# Patient Record
Sex: Male | Born: 1980 | Hispanic: Yes | Marital: Single | Smoking: Current every day smoker
Health system: Southern US, Community
[De-identification: ages and names within clinical notes are randomized; demographics above are authoritative.]

---

## 2020-07-15 ENCOUNTER — Other Ambulatory Visit: Payer: Self-pay

## 2020-07-15 ENCOUNTER — Encounter: Payer: Self-pay | Admitting: Emergency Medicine

## 2020-07-15 ENCOUNTER — Emergency Department
Admission: EM | Admit: 2020-07-15 | Discharge: 2020-07-15 | Disposition: A | Discharge status: Home / Self Care | Payer: Self-pay | Attending: Emergency Medicine | Admitting: Emergency Medicine

## 2020-07-15 DIAGNOSIS — R079 Chest pain, unspecified: Secondary | ICD-10-CM | POA: Insufficient documentation

## 2020-07-15 DIAGNOSIS — R1030 Lower abdominal pain, unspecified: Secondary | ICD-10-CM | POA: Insufficient documentation

## 2020-07-15 DIAGNOSIS — Z5321 Procedure and treatment not carried out due to patient leaving prior to being seen by health care provider: Secondary | ICD-10-CM | POA: Insufficient documentation

## 2020-07-15 LAB — COMPREHENSIVE METABOLIC PANEL
ALT: 60 U/L — ABNORMAL HIGH (ref 0–44)
AST: 76 U/L — ABNORMAL HIGH (ref 15–41)
Albumin: 4.4 g/dL (ref 3.5–5.0)
Alkaline Phosphatase: 64 U/L (ref 38–126)
Anion gap: 13 (ref 5–15)
BUN: 7 mg/dL (ref 6–20)
CO2: 23 mmol/L (ref 22–32)
Calcium: 9.1 mg/dL (ref 8.9–10.3)
Chloride: 100 mmol/L (ref 98–111)
Creatinine, Ser: 0.97 mg/dL (ref 0.61–1.24)
GFR, Estimated: >60 mL/min (ref >60)
Glucose, Bld: 103 mg/dL — ABNORMAL HIGH (ref 70–99)
Potassium: 3.4 mmol/L — ABNORMAL LOW (ref 3.5–5.1)
Sodium: 136 mmol/L (ref 135–145)
Total Bilirubin: 0.6 mg/dL (ref 0.3–1.2)
Total Protein: 8.2 g/dL — ABNORMAL HIGH (ref 6.5–8.1)

## 2020-07-15 LAB — URINALYSIS, COMPLETE (UACMP) WITH MICROSCOPIC
Bacteria, UA: NONE SEEN
Bilirubin Urine: NEGATIVE
Glucose, UA: NEGATIVE mg/dL
Ketones, ur: NEGATIVE mg/dL
Leukocytes,Ua: NEGATIVE
Nitrite: NEGATIVE
Protein, ur: 30 mg/dL — AB
Specific Gravity, Urine: 1.018 (ref 1.005–1.030)
pH: 5 (ref 5.0–8.0)

## 2020-07-15 LAB — CBC
HCT: 41.1 % (ref 39.0–52.0)
Hemoglobin: 13.4 g/dL (ref 13.0–17.0)
MCH: 29.2 pg (ref 26.0–34.0)
MCHC: 32.6 g/dL (ref 30.0–36.0)
MCV: 89.5 fL (ref 80.0–100.0)
Platelets: 352 10*3/uL (ref 150–400)
RBC: 4.59 MIL/uL (ref 4.22–5.81)
RDW: 18.2 % — ABNORMAL HIGH (ref 11.5–15.5)
WBC: 5.7 10*3/uL (ref 4.0–10.5)
nRBC: 0 % (ref 0.0–0.2)

## 2020-07-15 LAB — LIPASE, BLOOD: Lipase: 32 U/L (ref 11–51)

## 2020-07-15 NOTE — ED Triage Notes (Addendum)
Pt arrived via POV with reports of low abd pain since yesterday as well as R side pain in chest. Pt states he feels bloated and cramping. Pt states pain started after alcohol use.  Denies any vomiting or diarrhea, c/o nausea.  Denies any abdominal surgeries

## 2020-07-15 NOTE — ED Notes (Signed)
Pt called for VS reassessment in lobby. Unable to be located in ED or outside. Spanish interpreter also called pt name with no answer.

## 2020-07-15 NOTE — ED Notes (Signed)
Called multiple times for VS reassessment, no answer

## 2021-08-10 ENCOUNTER — Other Ambulatory Visit: Payer: Self-pay

## 2021-08-10 ENCOUNTER — Emergency Department: Payer: Self-pay

## 2021-08-10 ENCOUNTER — Emergency Department
Admission: EM | Admit: 2021-08-10 | Discharge: 2021-08-10 | Disposition: A | Discharge status: Home / Self Care | Payer: Self-pay | Attending: Emergency Medicine | Admitting: Emergency Medicine

## 2021-08-10 DIAGNOSIS — R072 Precordial pain: Secondary | ICD-10-CM | POA: Insufficient documentation

## 2021-08-10 LAB — CBC
HCT: 46.5 % (ref 39.0–52.0)
Hemoglobin: 15.2 g/dL (ref 13.0–17.0)
MCH: 29.8 pg (ref 26.0–34.0)
MCHC: 32.7 g/dL (ref 30.0–36.0)
MCV: 91.2 fL (ref 80.0–100.0)
Platelets: 380 10*3/uL (ref 150–400)
RBC: 5.1 MIL/uL (ref 4.22–5.81)
RDW: 13.9 % (ref 11.5–15.5)
WBC: 4.8 10*3/uL (ref 4.0–10.5)
nRBC: 0 % (ref 0.0–0.2)

## 2021-08-10 LAB — BASIC METABOLIC PANEL
Anion gap: 11 (ref 5–15)
BUN: 7 mg/dL (ref 6–20)
CO2: 22 mmol/L (ref 22–32)
Calcium: 8.9 mg/dL (ref 8.9–10.3)
Chloride: 107 mmol/L (ref 98–111)
Creatinine, Ser: 0.63 mg/dL (ref 0.61–1.24)
GFR, Estimated: >60 mL/min (ref >60)
Glucose, Bld: 105 mg/dL — ABNORMAL HIGH (ref 70–99)
Potassium: 3.8 mmol/L (ref 3.5–5.1)
Sodium: 140 mmol/L (ref 135–145)

## 2021-08-10 LAB — TROPONIN I (HIGH SENSITIVITY): Troponin I (High Sensitivity): <2 ng/L (ref <18)

## 2021-08-10 MED ORDER — IBUPROFEN 400 MG PO TABS: 400.0000 mg | ORAL_TABLET | Freq: Four times a day (QID) | ORAL | 0 refills | Status: DC | PRN

## 2021-08-10 MED ORDER — LIDOCAINE 5 % EX PTCH: 1.0000 | MEDICATED_PATCH | Freq: Two times a day (BID) | CUTANEOUS | 0 refills | Status: AC
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
## 2021-08-10 NOTE — ED Notes (Signed)
Signature pad not working at this time. Pt verbalized understanding of discharge instructions. Pt denies any question. Pt will call a taxi to come and pick him up. Pt ambulatory without difficulty.
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
## 2021-08-10 NOTE — Discharge Instructions (Addendum)
Take tylenol 1g every 8 hours Take your aleve And use patches for pain.   Return to ER if develop pain in lower abdomen or any other concerns.

## 2023-02-14 IMAGING — CR DG CHEST 2V
1 series · 2 of 2 positions shown · non-contrast
Comparison: No priors.

CLINICAL DATA: 40-year-old male with history of chest pain.

EXAM:
CHEST - 2 VIEW

[Series 1: dg chest 2 view · 0.14mm/px · 2 of 2 slices shown]
[im 1/2]
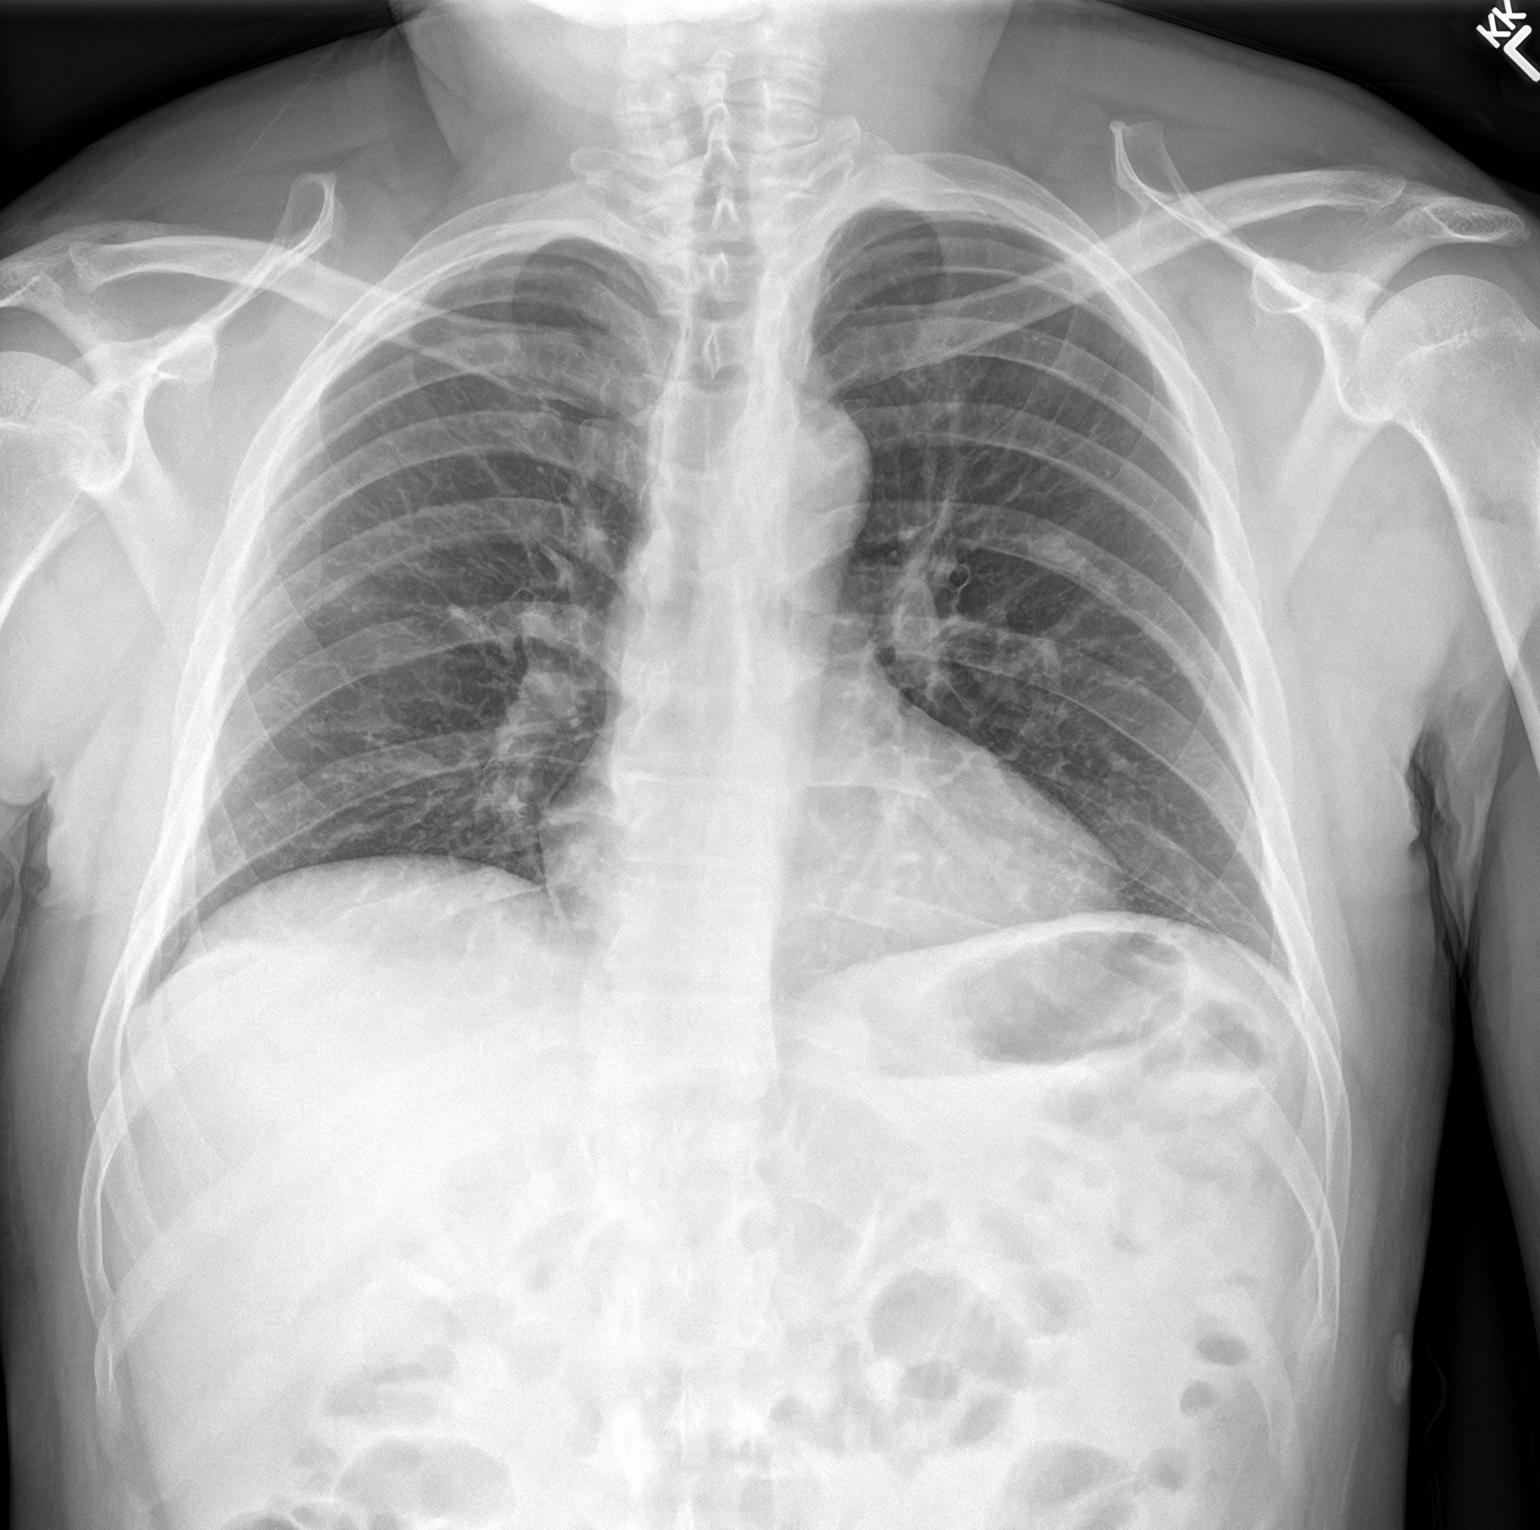
[im 2/2]
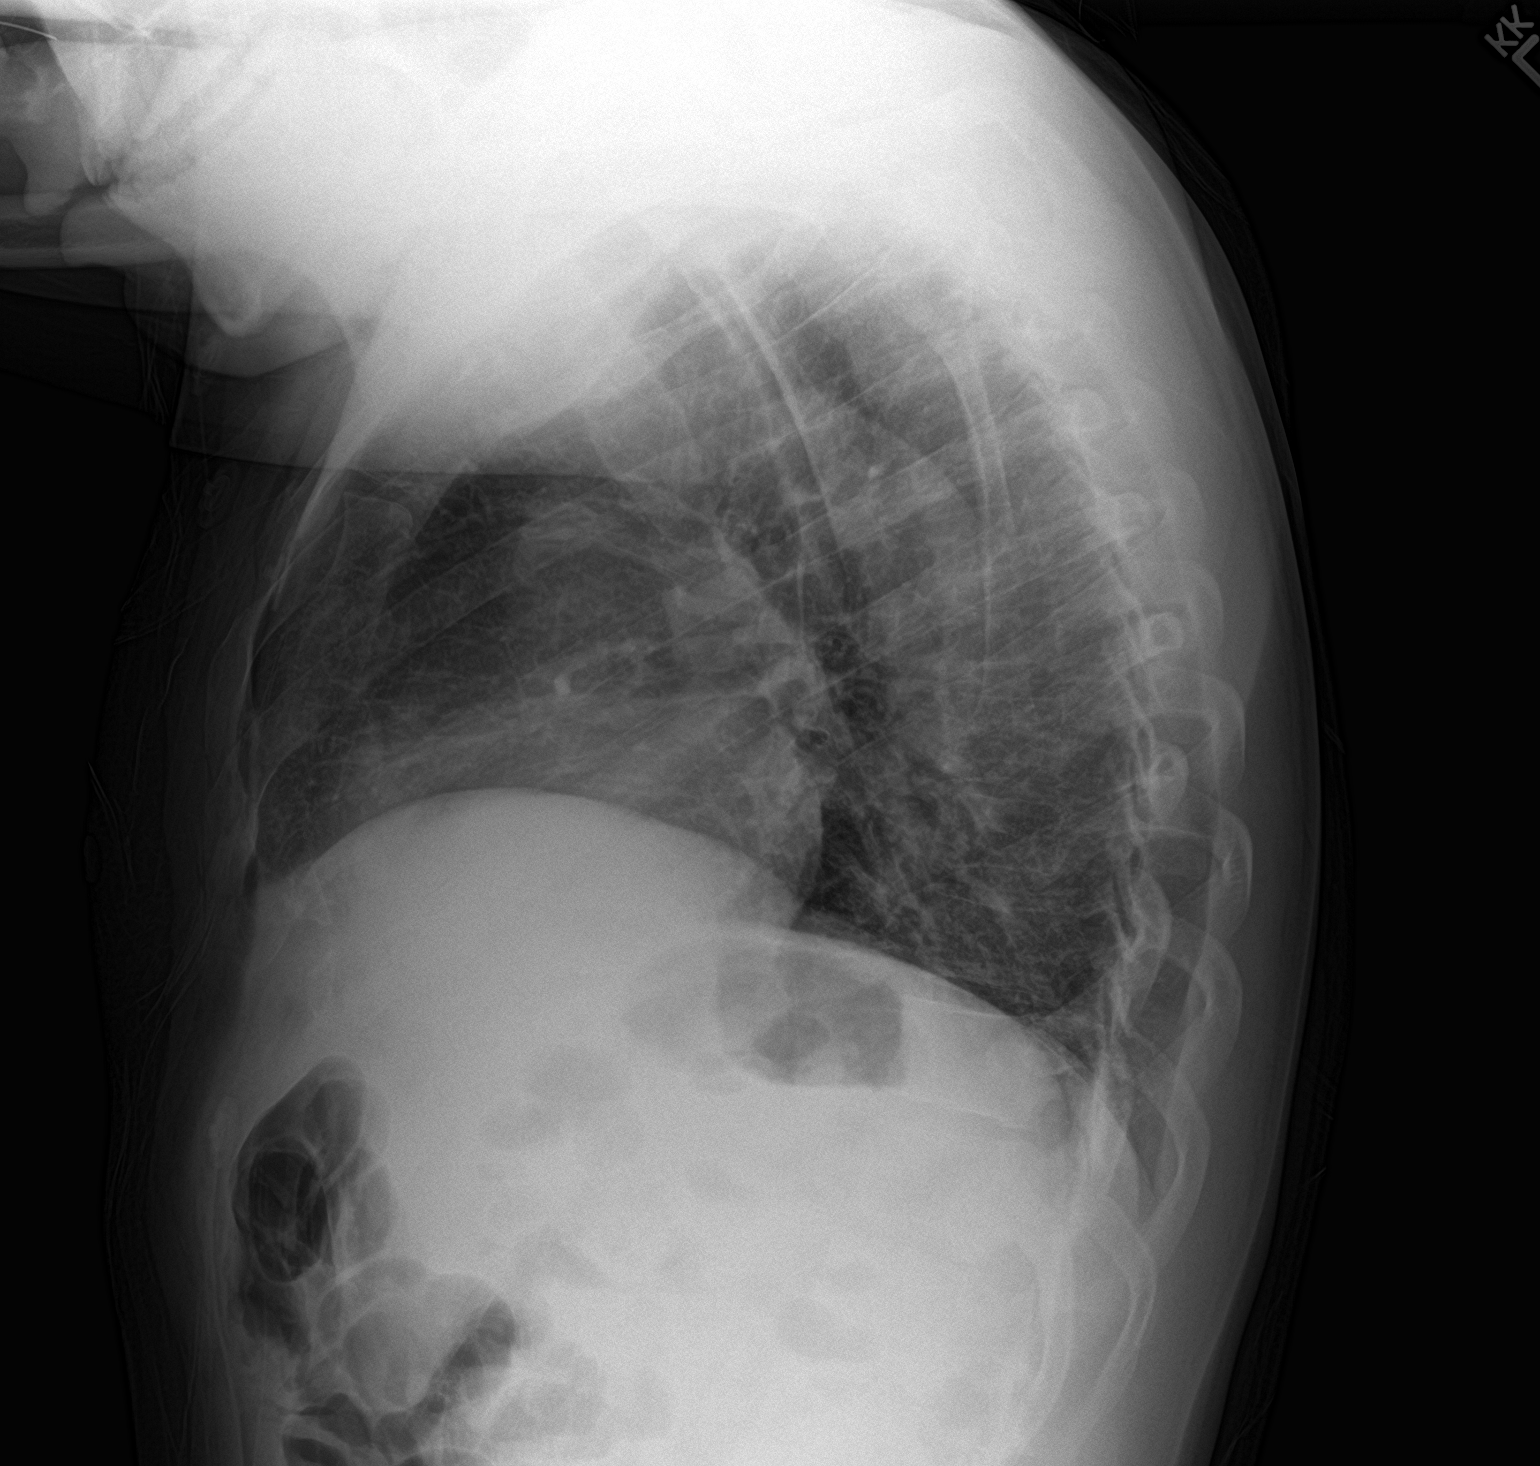

[2 of 2 positions shown; findings below may reference images not displayed]

FINDINGS: Lung volumes are normal. No consolidative airspace disease. No
pleural effusions. No pneumothorax. No pulmonary nodule or mass
noted. Pulmonary vasculature and the cardiomediastinal silhouette
are within normal limits.
IMPRESSION: No radiographic evidence of acute cardiopulmonary disease.
